# Patient Record
Sex: Male | Born: 1955 | Race: White | Hispanic: No | Marital: Married | Smoking: Current every day smoker
Health system: Southern US, Community
[De-identification: ages and names within clinical notes are randomized; demographics above are authoritative.]

## PROBLEM LIST (undated history)

## (undated) DIAGNOSIS — C801 Malignant (primary) neoplasm, unspecified: Secondary | ICD-10-CM

## (undated) DIAGNOSIS — J449 Chronic obstructive pulmonary disease, unspecified: Secondary | ICD-10-CM

## (undated) DIAGNOSIS — W3400XA Accidental discharge from unspecified firearms or gun, initial encounter: Secondary | ICD-10-CM

## (undated) HISTORY — PX: REVISION UROSTOMY CUTANEOUS: SUR1282

---

## 2015-10-02 ENCOUNTER — Emergency Department: Payer: Medicaid Other

## 2015-10-02 ENCOUNTER — Emergency Department
Admission: EM | Admit: 2015-10-02 | Discharge: 2015-10-02 | Disposition: A | Discharge status: Home / Self Care | Payer: Medicaid Other | Attending: Emergency Medicine | Admitting: Emergency Medicine

## 2015-10-02 ENCOUNTER — Encounter: Payer: Self-pay | Admitting: *Deleted

## 2015-10-02 DIAGNOSIS — F17200 Nicotine dependence, unspecified, uncomplicated: Secondary | ICD-10-CM | POA: Insufficient documentation

## 2015-10-02 DIAGNOSIS — J441 Chronic obstructive pulmonary disease with (acute) exacerbation: Secondary | ICD-10-CM | POA: Diagnosis not present

## 2015-10-02 DIAGNOSIS — R05 Cough: Secondary | ICD-10-CM | POA: Diagnosis present

## 2015-10-02 DIAGNOSIS — Z880 Allergy status to penicillin: Secondary | ICD-10-CM | POA: Insufficient documentation

## 2015-10-02 DIAGNOSIS — Z85528 Personal history of other malignant neoplasm of kidney: Secondary | ICD-10-CM | POA: Diagnosis not present

## 2015-10-02 DIAGNOSIS — R911 Solitary pulmonary nodule: Secondary | ICD-10-CM

## 2015-10-02 DIAGNOSIS — R319 Hematuria, unspecified: Secondary | ICD-10-CM | POA: Insufficient documentation

## 2015-10-02 HISTORY — DX: Malignant (primary) neoplasm, unspecified: C80.1

## 2015-10-02 HISTORY — DX: Chronic obstructive pulmonary disease, unspecified: J44.9

## 2015-10-02 HISTORY — DX: Accidental discharge from unspecified firearms or gun, initial encounter: W34.00XA

## 2015-10-02 LAB — URINALYSIS COMPLETE WITH MICROSCOPIC (ARMC ONLY)
BILIRUBIN URINE: NEGATIVE
Glucose, UA: NEGATIVE mg/dL
KETONES UR: NEGATIVE mg/dL
Nitrite: NEGATIVE
PH: 6 (ref 5.0–8.0)
Protein, ur: 30 mg/dL — AB
Specific Gravity, Urine: 1.015 (ref 1.005–1.030)

## 2015-10-02 LAB — COMPREHENSIVE METABOLIC PANEL
ALK PHOS: 60 U/L (ref 38–126)
ALT: 14 U/L — ABNORMAL LOW (ref 17–63)
ANION GAP: 8 (ref 5–15)
AST: 20 U/L (ref 15–41)
Albumin: 4.2 g/dL (ref 3.5–5.0)
BILIRUBIN TOTAL: 0.3 mg/dL (ref 0.3–1.2)
BUN: 18 mg/dL (ref 6–20)
CALCIUM: 9.3 mg/dL (ref 8.9–10.3)
CO2: 26 mmol/L (ref 22–32)
Chloride: 105 mmol/L (ref 101–111)
Creatinine, Ser: 0.81 mg/dL (ref 0.61–1.24)
Glucose, Bld: 90 mg/dL (ref 65–99)
Potassium: 3.7 mmol/L (ref 3.5–5.1)
SODIUM: 139 mmol/L (ref 135–145)
TOTAL PROTEIN: 7.6 g/dL (ref 6.5–8.1)

## 2015-10-02 LAB — CBC
HCT: 46.4 % (ref 40.0–52.0)
HEMOGLOBIN: 15.7 g/dL (ref 13.0–18.0)
MCH: 30.3 pg (ref 26.0–34.0)
MCHC: 33.8 g/dL (ref 32.0–36.0)
MCV: 89.6 fL (ref 80.0–100.0)
Platelets: 248 10*3/uL (ref 150–440)
RBC: 5.18 MIL/uL (ref 4.40–5.90)
RDW: 13.4 % (ref 11.5–14.5)
WBC: 10.3 10*3/uL (ref 3.8–10.6)

## 2015-10-02 MED ORDER — METHYLPREDNISOLONE SODIUM SUCC 125 MG IJ SOLR
125.0000 mg | Freq: Once | INTRAMUSCULAR | Status: AC
  Administered 2015-10-02: 125 mg via INTRAVENOUS
  Filled 2015-10-02: qty 2

## 2015-10-02 MED ORDER — PREDNISONE 10 MG PO TABS
50.0000 mg | ORAL_TABLET | Freq: Every day | ORAL | Status: AC
Start: 2015-10-02 — End: ?

## 2015-10-02 MED ORDER — IOHEXOL 300 MG/ML  SOLN
75.0000 mL | Freq: Once | INTRAMUSCULAR | Status: AC | PRN
  Administered 2015-10-02: 75 mL via INTRAVENOUS

## 2015-10-02 MED ORDER — ACETAMINOPHEN 500 MG PO TABS
1000.0000 mg | ORAL_TABLET | Freq: Once | ORAL | Status: AC
  Administered 2015-10-02: 1000 mg via ORAL
  Filled 2015-10-02: qty 2

## 2015-10-02 MED ORDER — IPRATROPIUM-ALBUTEROL 0.5-2.5 (3) MG/3ML IN SOLN
3.0000 mL | Freq: Once | RESPIRATORY_TRACT | Status: AC
  Administered 2015-10-02: 3 mL via RESPIRATORY_TRACT
  Filled 2015-10-02: qty 3

## 2015-10-02 MED ORDER — ALBUTEROL SULFATE HFA 108 (90 BASE) MCG/ACT IN AERS: 2.0000 | INHALATION_SPRAY | Freq: Four times a day (QID) | RESPIRATORY_TRACT | Status: AC | PRN

## 2015-10-02 MED ORDER — DOXYCYCLINE HYCLATE 100 MG PO TABS
100.0000 mg | ORAL_TABLET | Freq: Once | ORAL | Status: AC
  Administered 2015-10-02: 100 mg via ORAL
  Filled 2015-10-02: qty 1

## 2015-10-02 MED ORDER — DIPHENHYDRAMINE HCL 50 MG/ML IJ SOLN
50.0000 mg | Freq: Once | INTRAMUSCULAR | Status: AC
  Administered 2015-10-02: 50 mg via INTRAVENOUS
  Filled 2015-10-02: qty 1

## 2015-10-02 MED ORDER — DOXYCYCLINE HYCLATE 50 MG PO CAPS: 100.0000 mg | ORAL_CAPSULE | Freq: Two times a day (BID) | ORAL | Status: AC
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
## 2015-10-02 NOTE — ED Notes (Signed)
Pt states SOB and cough, hx of COPD, states green productive cough, also states bloody urine which is not normal, pt has urostomy bag from hx of bladder cancer

## 2015-10-02 NOTE — Discharge Instructions (Signed)
You were evaluated for shortness of breath and found to have wheezing/COPD exacerbation and her being treated for possible infection with doxycycline. Albuterol inhaler 2 puffs every 4 hours as needed for wheezing and shortness of breath.  Return to the emergency department for any worsening condition including fever, trouble breathing or shortness breath, chest pain, or any other symptoms concerning to you.  Your CT scan showed a lung nodule, and you'll need a repeat CT scan in approximately 3 months, discussed this with the primary care physician.   Chronic Obstructive Pulmonary Disease Exacerbation Chronic obstructive pulmonary disease (COPD) is a common lung condition in which airflow from the lungs is limited. COPD is a general term that can be used to describe many different lung problems that limit airflow, including chronic bronchitis and emphysema. COPD exacerbations are episodes when breathing symptoms become much worse and require extra treatment. Without treatment, COPD exacerbations can be life threatening, and frequent COPD exacerbations can cause further damage to your lungs. CAUSES  Respiratory infections.  Exposure to smoke.  Exposure to air pollution, chemical fumes, or dust. Sometimes there is no apparent cause or trigger. RISK FACTORS  Smoking cigarettes.  Older age.  Frequent prior COPD exacerbations. SIGNS AND SYMPTOMS  Increased coughing.  Increased thick spit (sputum) production.  Increased wheezing.  Increased shortness of breath.  Rapid breathing.  Chest tightness. DIAGNOSIS Your medical history, a physical exam, and tests will help your health care provider make a diagnosis. Tests may include:  A chest X-ray.  Basic lab tests.  Sputum testing.  An arterial blood gas test. TREATMENT Depending on the severity of your COPD exacerbation, you may need to be admitted to a hospital for treatment. Some of the treatments commonly used to treat COPD  exacerbations are:   Antibiotic medicines.  Bronchodilators. These are drugs that expand the air passages. They may be given with an inhaler or nebulizer. Spacer devices may be needed to help improve drug delivery.  Corticosteroid medicines.  Supplemental oxygen therapy.  Airway clearing techniques, such as noninvasive ventilation (NIV) and positive expiratory pressure (PEP). These provide respiratory support through a mask or other noninvasive device. HOME CARE INSTRUCTIONS  Do not smoke. Quitting smoking is very important to prevent COPD from getting worse and exacerbations from happening as often.  Avoid exposure to all substances that irritate the airway, especially to tobacco smoke.  If you were prescribed an antibiotic medicine, finish it all even if you start to feel better.  Take all medicines as directed by your health care provider.It is important to use correct technique with inhaled medicines.  Drink enough fluids to keep your urine clear or pale yellow (unless you have a medical condition that requires fluid restriction).  Use a cool mist vaporizer. This makes it easier to clear your chest when you cough.  If you have a home nebulizer and oxygen, continue to use them as directed.  Maintain all necessary vaccinations to prevent infections.  Exercise regularly.  Eat a healthy diet.  Keep all follow-up appointments as directed by your health care provider. SEEK IMMEDIATE MEDICAL CARE IF:  You have worsening shortness of breath.  You have trouble talking.  You have severe chest pain.  You have blood in your sputum.  You have a fever.  You have weakness, vomit repeatedly, or faint.  You feel confused.  You continue to get worse. MAKE SURE YOU:  Understand these instructions.  Will watch your condition.  Will get help right away if you  are not doing well or get worse.   This information is not intended to replace advice given to you by your health  care provider. Make sure you discuss any questions you have with your health care provider.   Document Released: 08/02/2007 Document Revised: 10/26/2014 Document Reviewed: 06/09/2013 Elsevier Interactive Patient Education [employer].

## 2015-10-02 NOTE — ED Notes (Signed)
Main lab notified to add urine culture, spoke to [place], states will add.

## 2015-10-06 LAB — URINE CULTURE

## 2015-10-07 NOTE — Progress Notes (Signed)
Urine cx grew 80,000 colonies of Ecoli. Pt has urostomy bag, no symptoms on visit. After speaking with Dr. Marcelene Butte, decision not to treat was made  Ramond Dial, Pharm.D Clinical Pharmacist

## 2016-06-11 IMAGING — CR DG CHEST 2V
1 series · 2 of 2 positions shown · non-contrast
Comparison: None

CLINICAL DATA: Shortness of breath with cough productive of green
phlegm, nausea, and body aches for 3 days, history smoking, COPD,
blood in urine

EXAM:
CHEST  2 VIEW

[Series 1: dg chest 2 view · 0.14mm/px · 2 of 2 slices shown]
[im 1/2]
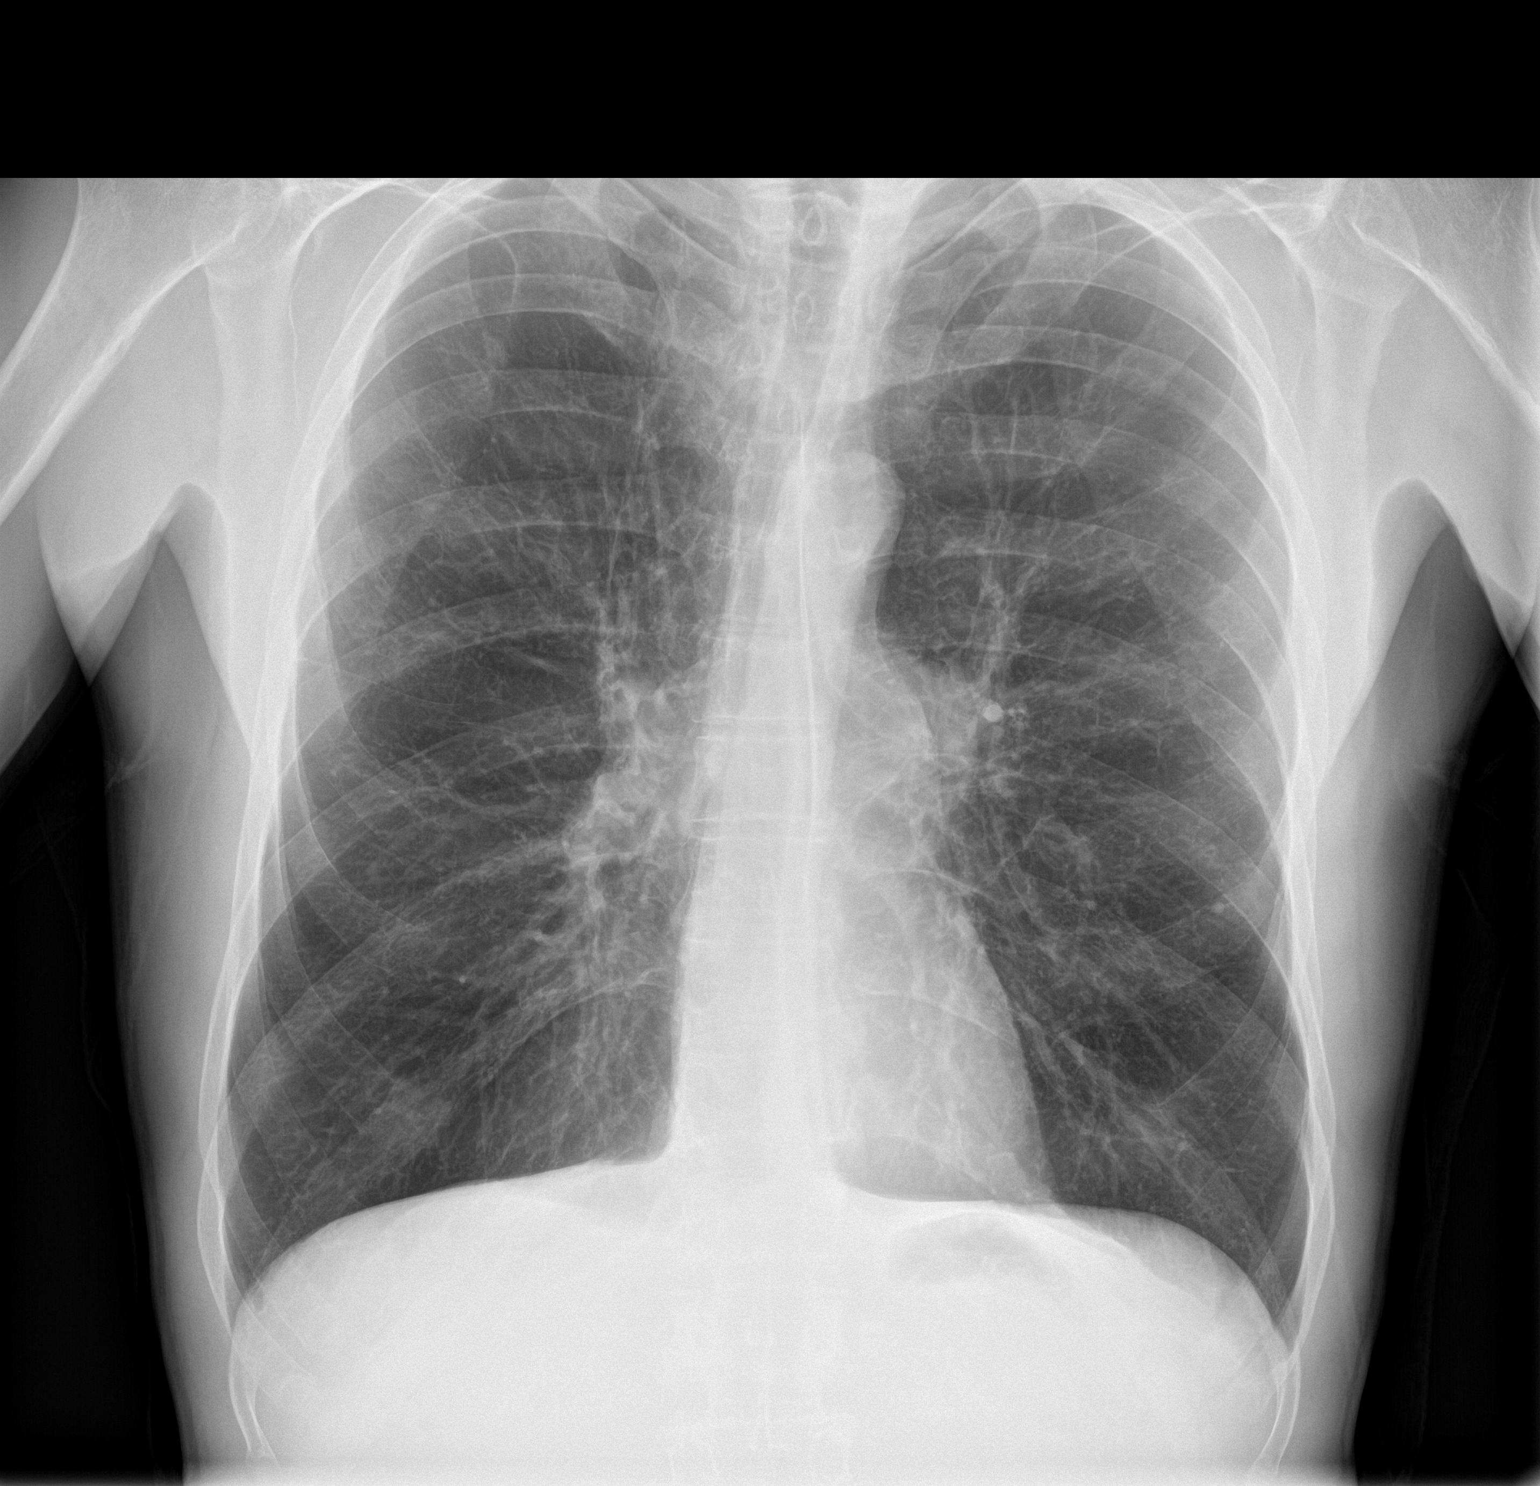
[im 2/2]
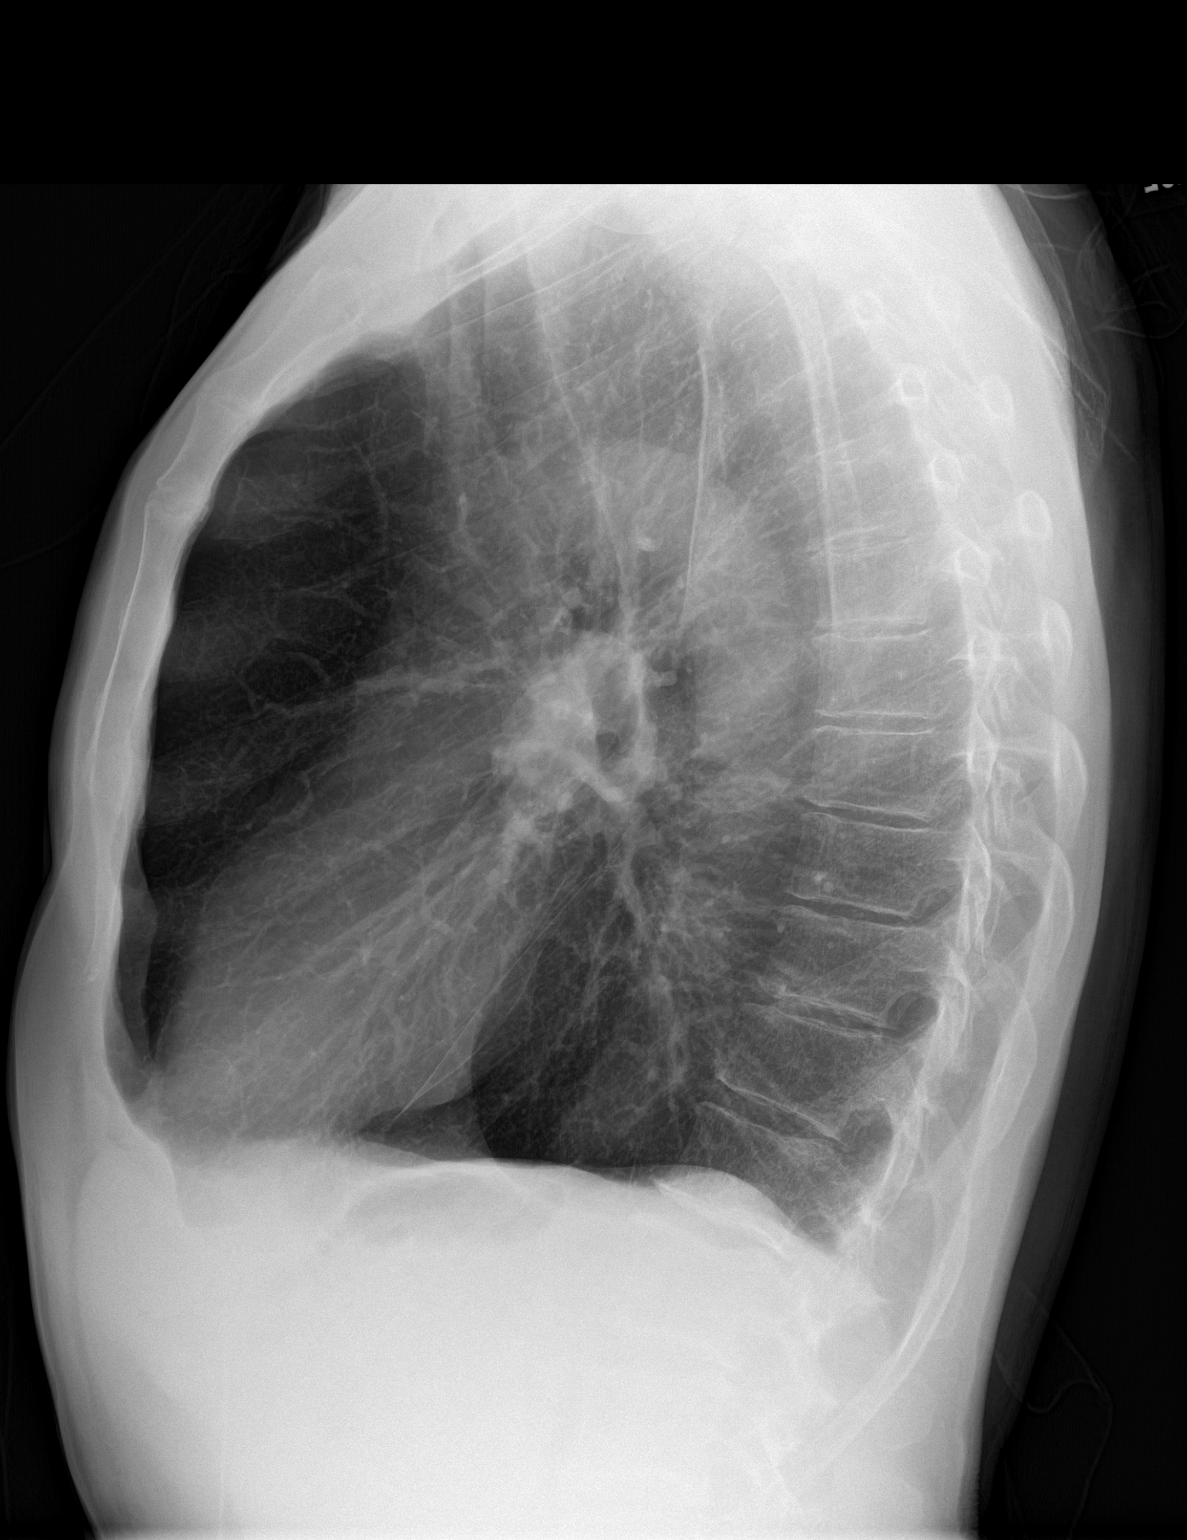

[2 of 2 positions shown; findings below may reference images not displayed]

FINDINGS: Normal heart size and pulmonary vascularity.

Abnormal enlargement of LEFT pulmonary hilum question adenopathy
versus mass.

Emphysematous and bronchitic changes consistent with COPD.

Questionable ovoid nodular density RIGHT upper lobe 8 mm diameter.

Remaining lungs clear.

No pleural effusion or pneumothorax.

Bones demineralized.
IMPRESSION: COPD changes with enlargement of LEFT pulmonary hilum question
adenopathy versus mass.

Potentially mm RIGHT upper lobe pulmonary nodule.

CT chest with contrast recommended to further evaluate.

## 2016-06-11 IMAGING — CT CT CHEST W/ CM
1 series · 15 of 33 positions shown, 19 images · IV contrast (omnipaque)
Comparison: Chest radiograph of earlier today.

CLINICAL DATA: Possible left hilar enlargement and possible right
upper lobe pulmonary nodule on plain film. Shortness of breath.
Smoker with COPD.

EXAM:
CT CHEST WITH CONTRAST
TECHNIQUE: Multidetector CT imaging of the chest was performed during
intravenous contrast administration.
CONTRAST:  75mL OMNIPAQUE IOHEXOL 300 MG/ML  SOLN

[Series 2: routine chest with · axial · 0.63mm/px · z∈[-639,-349]mm · 15 of 70 slices shown, 19 images]
[im 6/70  mediastinal]
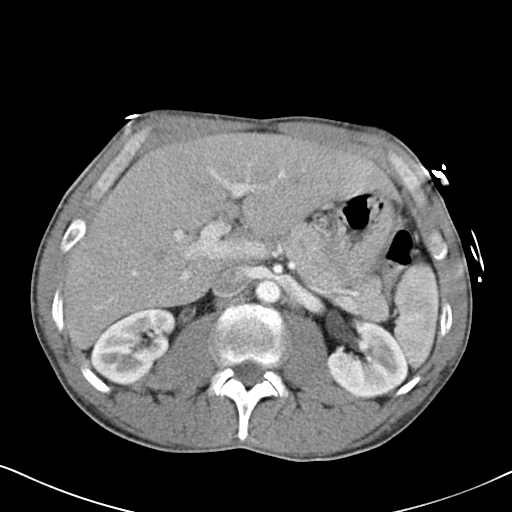
[im 6/70  lung]
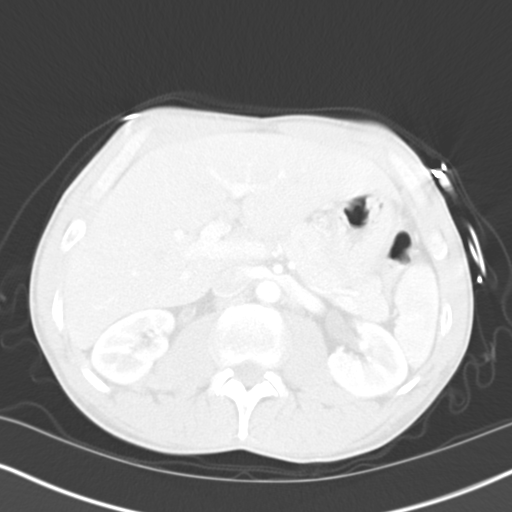
[im 11/70  lung]
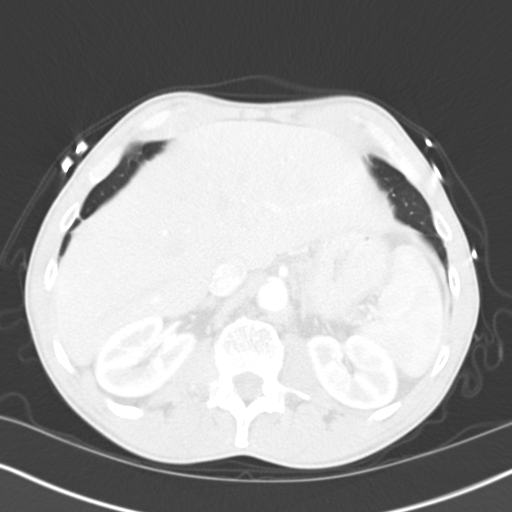
[im 14/70  lung]
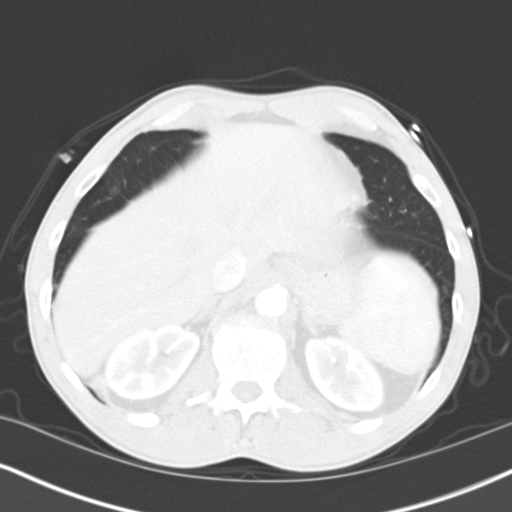
[im 18/70  lung]
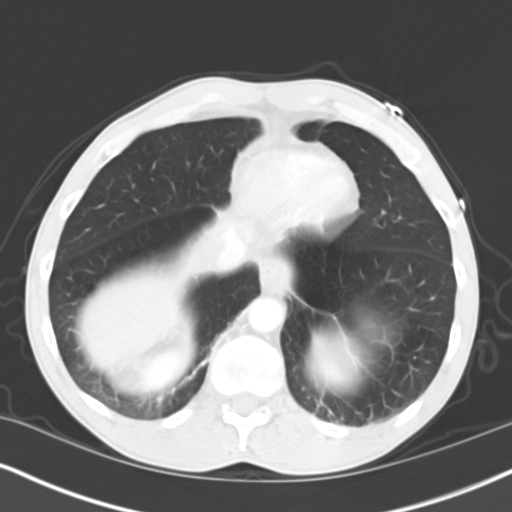
[im 24/70  mediastinal]
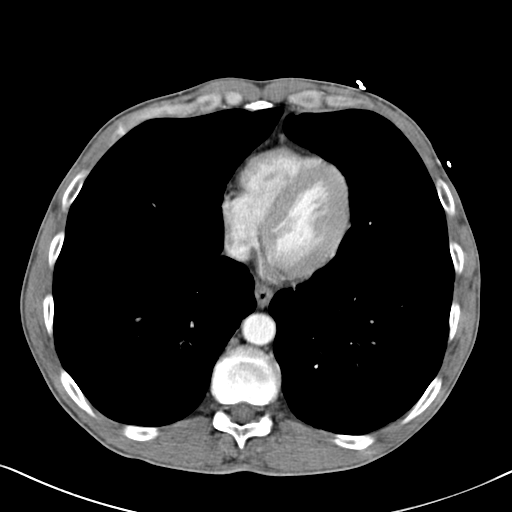
[im 24/70  lung]
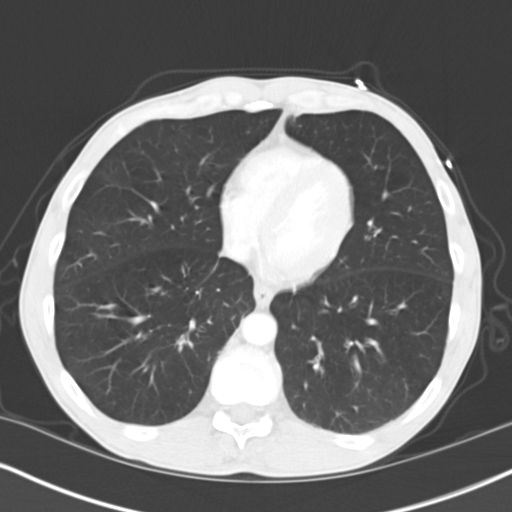
[im 28/70  lung]
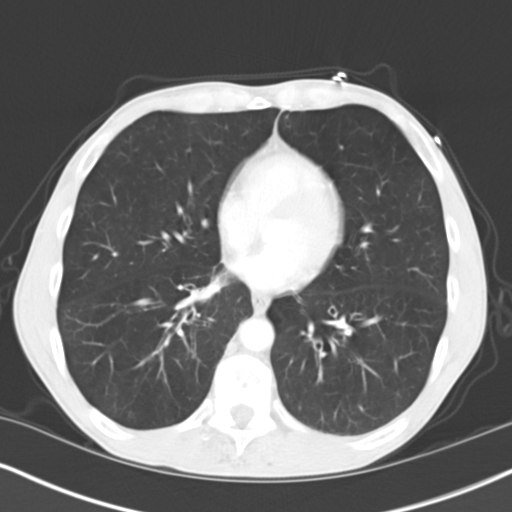
[im 31/70  lung]
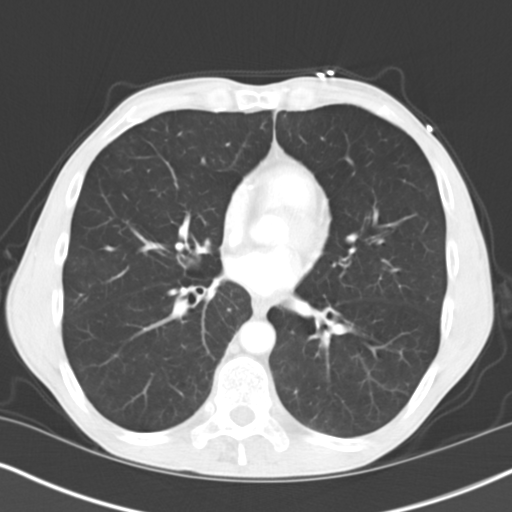
[im 36/70  lung]
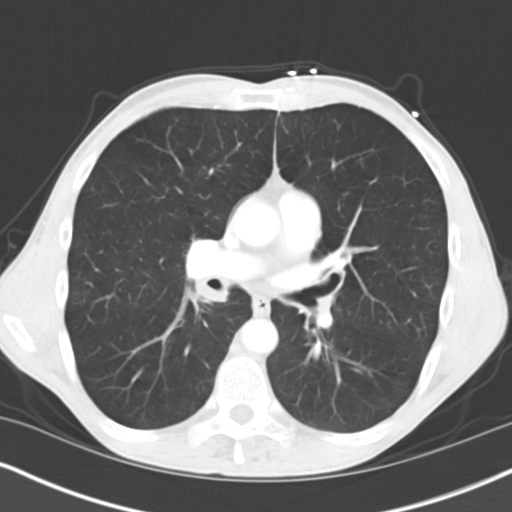
[im 39/70  mediastinal]
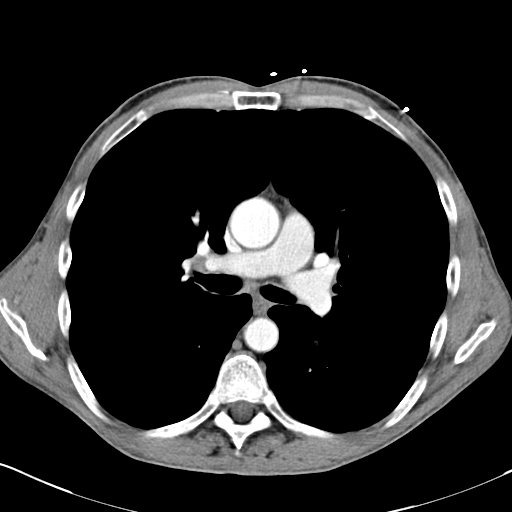
[im 39/70  lung]
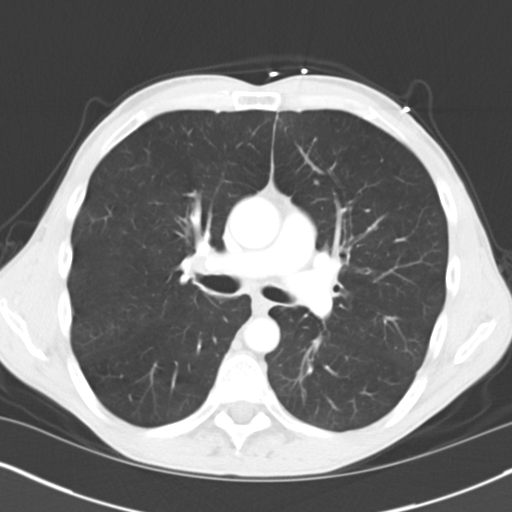
[im 42/70  lung]
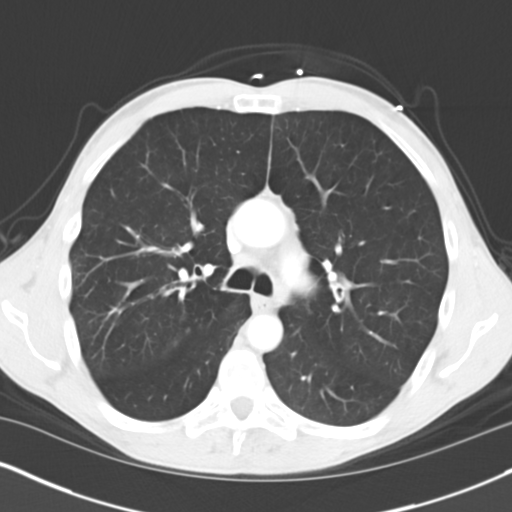
[im 47/70  lung]
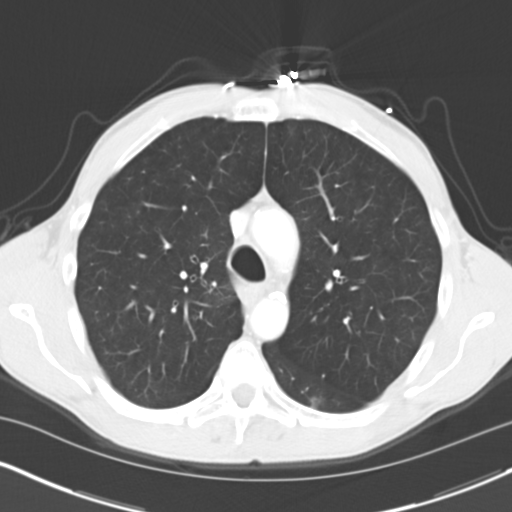
[im 52/70  lung]
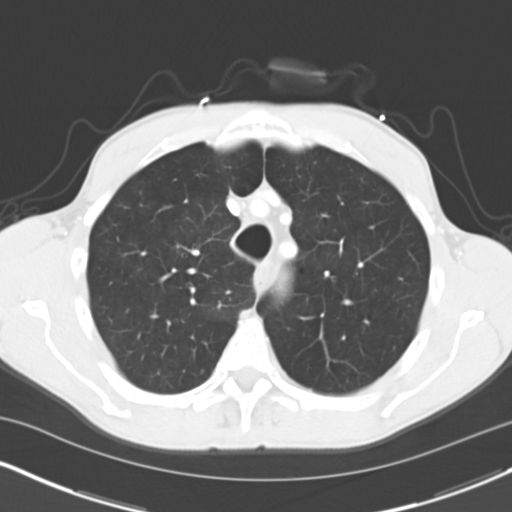
[im 56/70  mediastinal]
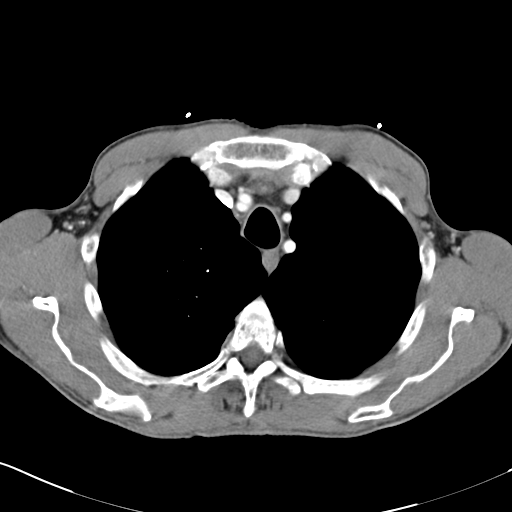
[im 56/70  lung]
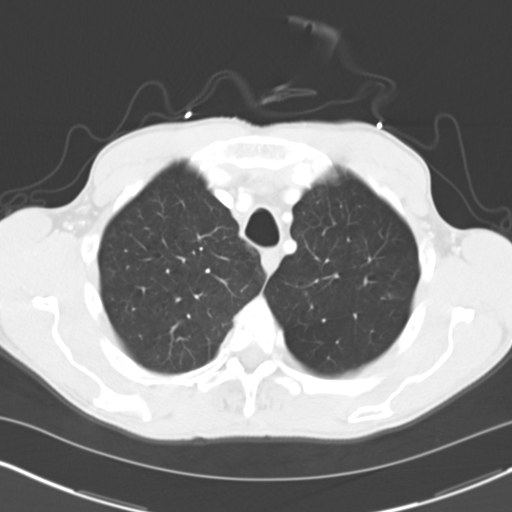
[im 59/70  lung]
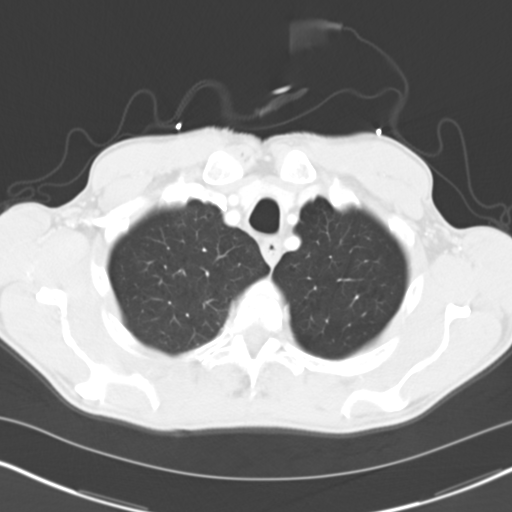
[im 64/70  lung]
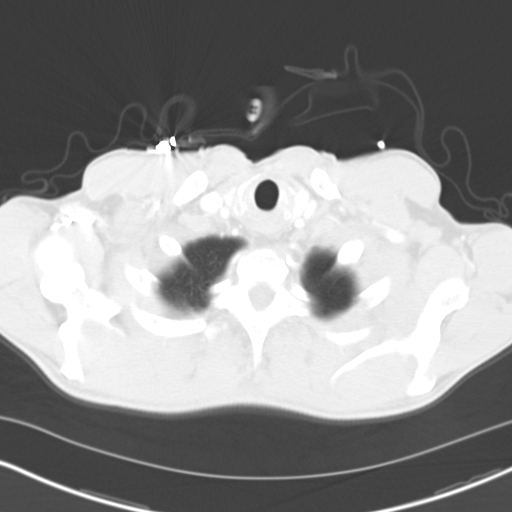

[15 of 33 positions shown; findings below may reference images not displayed]

FINDINGS: Mediastinum/Nodes: Aortic atherosclerosis. Normal heart size,
without pericardial effusion. No mediastinal or hilar adenopathy.

Lungs/Pleura: No pleural fluid. Mild to moderate centrilobular
emphysema.

Moderate bronchial wall thickening versus dependent fluid within the
bronchus intermedius and origin of the right lower lobe bronchus.
Example image 37/series 3.

No right upper lobe pulmonary nodule identified. Minimal posterior
right upper lobe clustered peribronchovascular micro nodularity,
including image 20/series 3.

Calcified granuloma in the left lower lobe on image 39/series 3.

A noncalcified 3 mm left upper lobe pulmonary nodule on image
28/series 3.

Probable atelectasis in the superior segment left lower lobe
dependently on image 24/series 3.

Upper abdomen: Normal imaged portions of the liver, spleen, stomach,
pancreas, adrenal glands, kidneys.

Musculoskeletal: No acute osseous abnormality.
IMPRESSION: 1. No evidence of left hilar adenopathy or dominant right upper lobe
pulmonary nodule to correspond to the plain film abnormalities.
2. Subtle posterior right upper lobe clustered micro nodularity,
suspicious for minimal infection. Although of indeterminate acuity,
favor acute given the clinical history.
3. Primarily dependent soft tissue thickening versus fluid within
the right endobronchial tree. Potential clinical strategies include
three-month follow-up CT to confirm resolution versus more
aggressive evaluation with nonemergent bronchoscopy to exclude
neoplasm.
4. A left upper lobe 3 mm pulmonary nodule warrants followup
attention.

## 2017-07-19 DEATH — deceased
